# Patient Record
Sex: Female | Born: 1955 | Race: Black or African American | Hispanic: No | Marital: Married
Health system: Southern US, Community
[De-identification: ages and names within clinical notes are randomized; demographics above are authoritative.]

## PROBLEM LIST (undated history)

## (undated) DIAGNOSIS — E785 Hyperlipidemia, unspecified: Secondary | ICD-10-CM

## (undated) HISTORY — DX: Hyperlipidemia, unspecified: E78.5

---

## 2005-07-29 ENCOUNTER — Ambulatory Visit (HOSPITAL_COMMUNITY): Admission: RE | Admit: 2005-07-29 | Discharge: 2005-07-29 | Payer: Self-pay | Admitting: Internal Medicine

## 2005-11-13 ENCOUNTER — Other Ambulatory Visit: Admission: RE | Admit: 2005-11-13 | Discharge: 2005-11-13 | Payer: Self-pay | Admitting: Internal Medicine

## 2006-08-02 ENCOUNTER — Ambulatory Visit (HOSPITAL_COMMUNITY): Admission: RE | Admit: 2006-08-02 | Discharge: 2006-08-02 | Payer: Self-pay | Admitting: Internal Medicine

## 2006-08-17 ENCOUNTER — Encounter: Admission: RE | Admit: 2006-08-17 | Discharge: 2006-08-17 | Payer: Self-pay | Admitting: Internal Medicine

## 2007-02-04 ENCOUNTER — Encounter: Admission: RE | Admit: 2007-02-04 | Discharge: 2007-02-04 | Payer: Self-pay | Admitting: Internal Medicine

## 2007-03-29 ENCOUNTER — Other Ambulatory Visit: Admission: RE | Admit: 2007-03-29 | Discharge: 2007-03-29 | Payer: Self-pay | Admitting: Internal Medicine

## 2007-08-16 ENCOUNTER — Encounter: Admission: RE | Admit: 2007-08-16 | Discharge: 2007-08-16 | Payer: Self-pay | Admitting: Internal Medicine

## 2008-03-27 ENCOUNTER — Other Ambulatory Visit: Admission: RE | Admit: 2008-03-27 | Discharge: 2008-03-27 | Payer: Self-pay | Admitting: Internal Medicine

## 2008-05-05 IMAGING — MG MM DIGITAL SCREENING BILAT
5 series · 5 of 5 positions shown · non-contrast
Comparison: none

DG SCREEN MAMMOGRAM BILATERAL
Bilateral CC and MLO view(s) were taken.

DIGITAL SCREENING MAMMOGRAM WITH CAD:
There are scattered fibroglandular densities.  A possible mass is noted in the left breast.  Spot 
compression views and possibly sonography are recommended for further evaluation.  In the right 
breast, no masses or malignant type calcifications are identified.  Compared with prior studies.

[R CC]
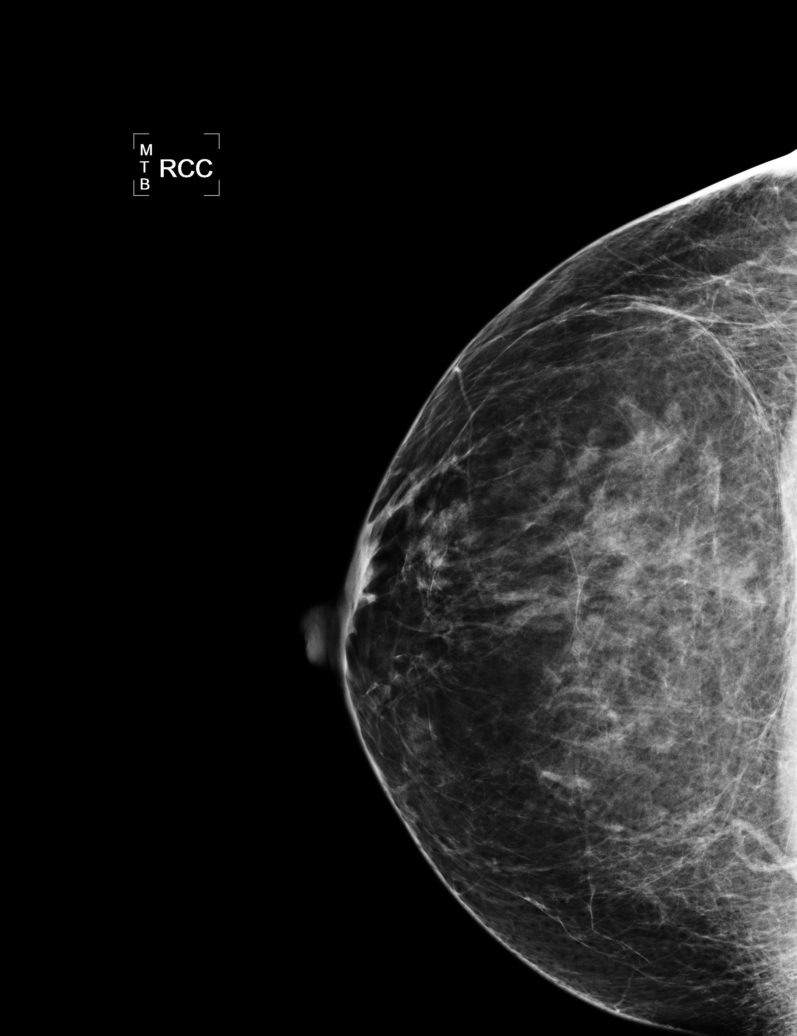

[R MLO]
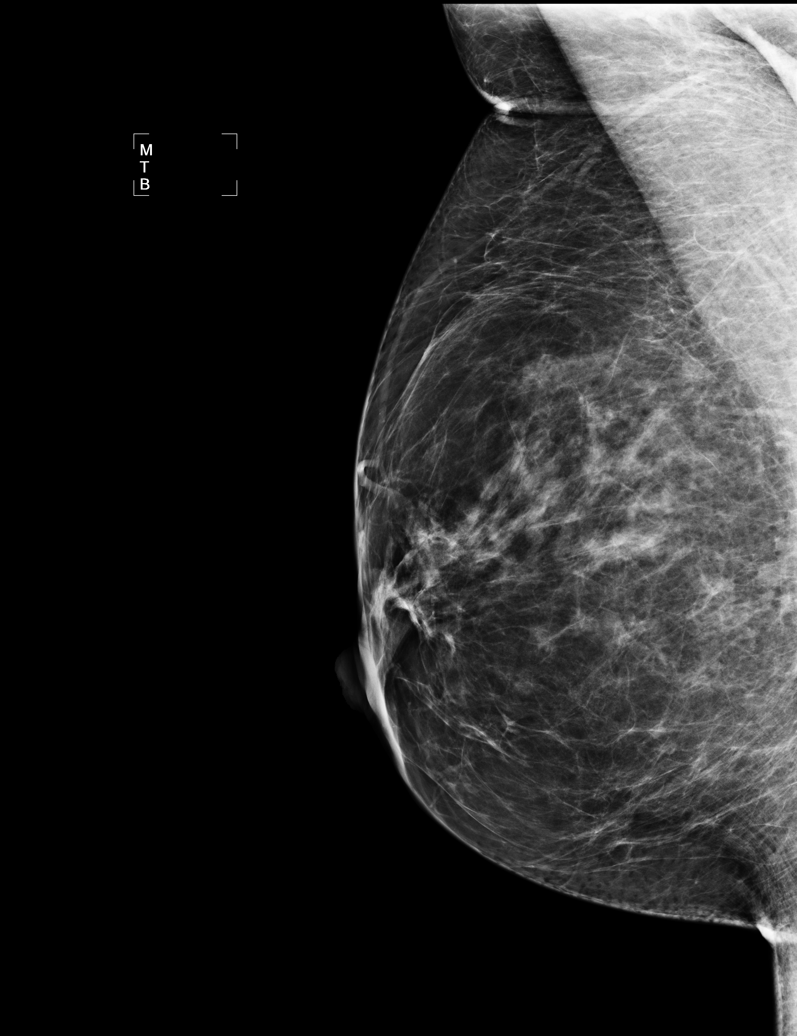

[L CC]
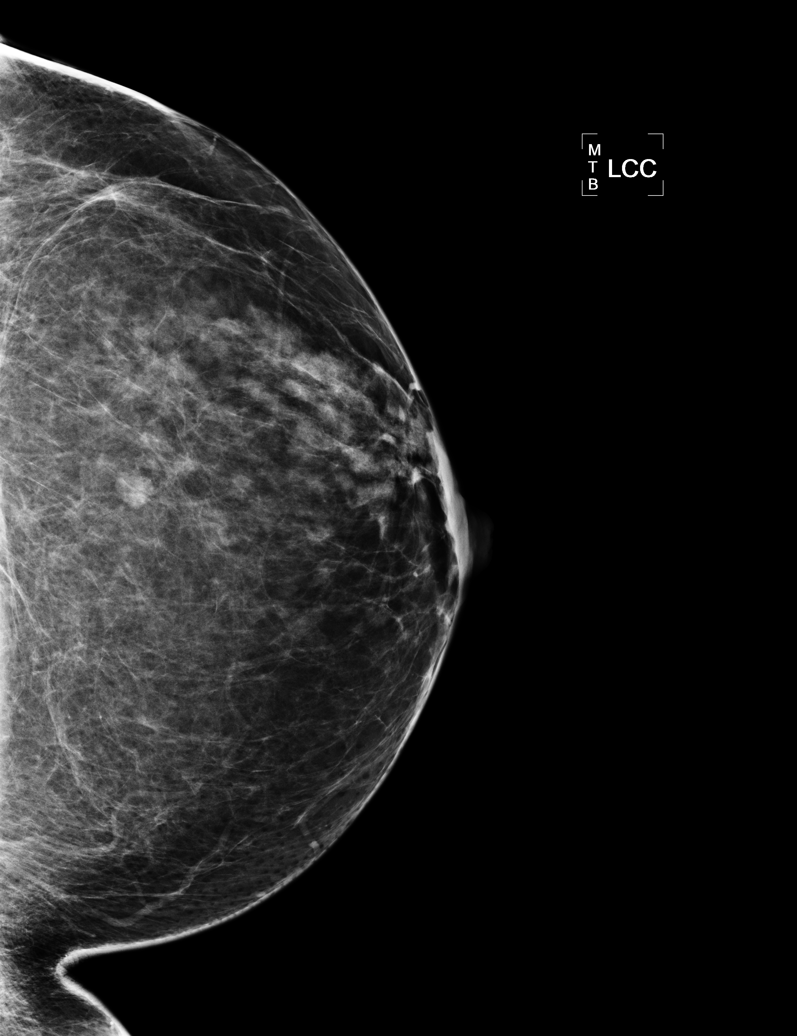

[L MLO]
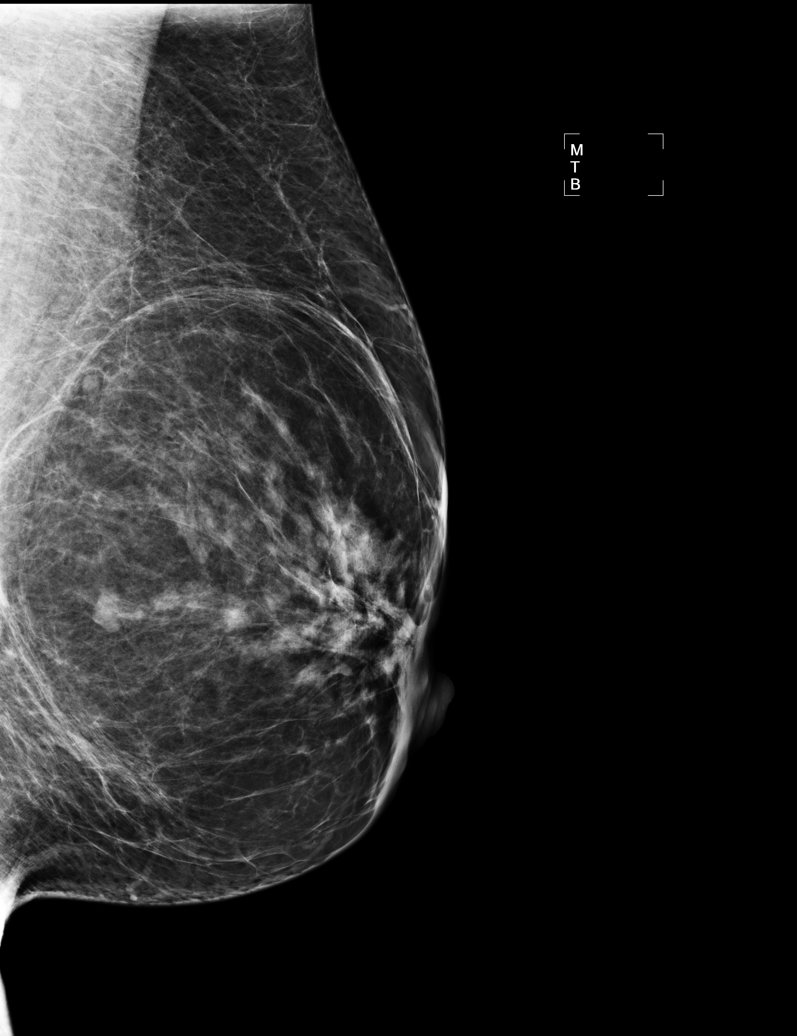

[R CV]
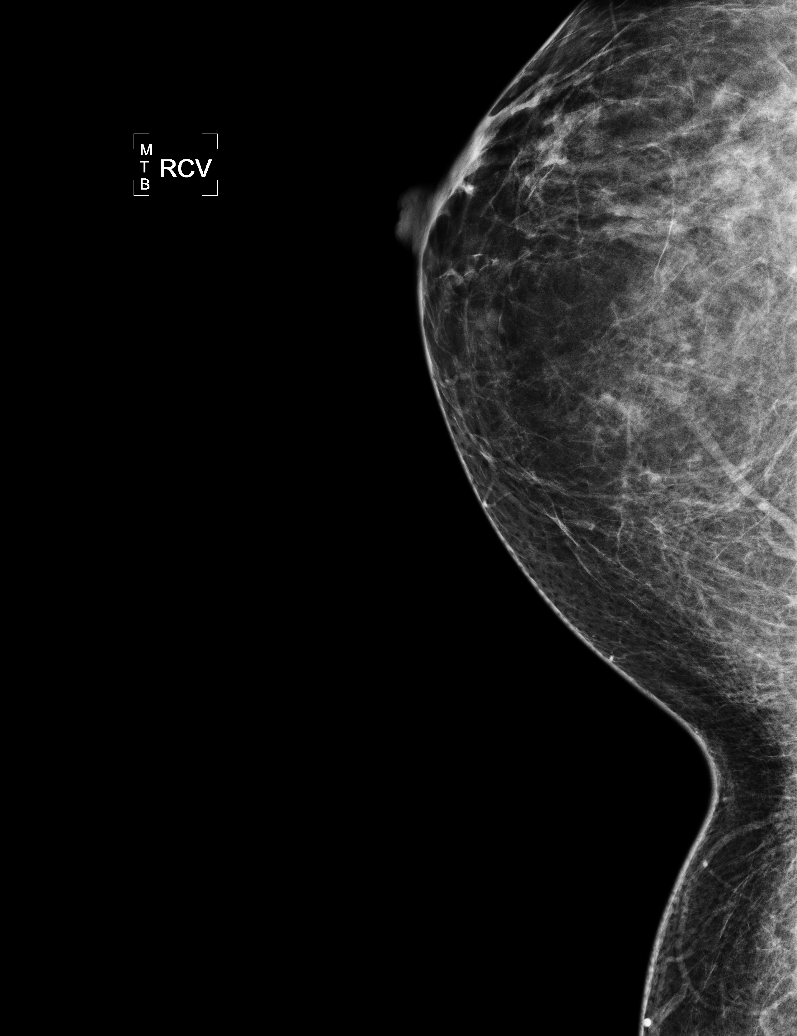

[5 of 5 positions shown; findings below may reference images not displayed]

IMPRESSION: Possible mass, left breast.  Additional evaluation is indicated.  The patient will be contacted for
additional studies and a supplementary report will follow.  No specific mammographic evidence of 
malignancy, right breast.

ASSESSMENT: Need additional imaging evaluation and/or prior mammograms for comparison - BI-RADS 0

Further imaging of the left breast.
ANALYZED BY COMPUTER AIDED DETECTION. , THIS PROCEDURE WAS A DIGITAL MAMMOGRAM.

## 2008-08-20 ENCOUNTER — Ambulatory Visit (HOSPITAL_COMMUNITY): Admission: RE | Admit: 2008-08-20 | Discharge: 2008-08-20 | Payer: Self-pay | Admitting: Internal Medicine

## 2009-09-17 ENCOUNTER — Ambulatory Visit (HOSPITAL_COMMUNITY): Admission: RE | Admit: 2009-09-17 | Discharge: 2009-09-17 | Payer: Self-pay | Admitting: Internal Medicine

## 2010-09-01 ENCOUNTER — Other Ambulatory Visit: Payer: Self-pay | Admitting: Internal Medicine

## 2010-09-01 DIAGNOSIS — Z1231 Encounter for screening mammogram for malignant neoplasm of breast: Secondary | ICD-10-CM

## 2010-09-25 ENCOUNTER — Ambulatory Visit (HOSPITAL_COMMUNITY)
Admission: RE | Admit: 2010-09-25 | Discharge: 2010-09-25 | Disposition: A | Discharge status: Home / Self Care | Payer: BC Managed Care – HMO | Source: Ambulatory Visit | Attending: Internal Medicine | Admitting: Internal Medicine

## 2010-09-25 DIAGNOSIS — Z1231 Encounter for screening mammogram for malignant neoplasm of breast: Secondary | ICD-10-CM

## 2011-04-19 ENCOUNTER — Emergency Department (HOSPITAL_COMMUNITY): Payer: BC Managed Care – PPO

## 2011-04-19 ENCOUNTER — Observation Stay (HOSPITAL_COMMUNITY)
Admission: EM | Admit: 2011-04-19 | Discharge: 2011-04-20 | Disposition: A | Discharge status: Home / Self Care | Payer: BC Managed Care – PPO | Attending: Internal Medicine | Admitting: Internal Medicine

## 2011-04-19 DIAGNOSIS — R55 Syncope and collapse: Principal | ICD-10-CM | POA: Insufficient documentation

## 2011-04-19 LAB — CBC
HCT: 40.2 % (ref 36.0–46.0)
Hemoglobin: 13.4 g/dL (ref 12.0–15.0)
MCV: 89.3 fL (ref 78.0–100.0)
Platelets: 196 10*3/uL (ref 150–400)
WBC: 6.3 10*3/uL (ref 4.0–10.5)

## 2011-04-19 LAB — COMPREHENSIVE METABOLIC PANEL
ALT: 10 U/L (ref 0–35)
AST: 17 U/L (ref 0–37)
Albumin: 3.7 g/dL (ref 3.5–5.2)
Alkaline Phosphatase: 65 U/L (ref 39–117)
BUN: 11 mg/dL (ref 6–23)
Chloride: 104 mEq/L (ref 96–112)
GFR calc Af Amer: >90 mL/min (ref >90)
GFR calc non Af Amer: 83 mL/min — ABNORMAL LOW (ref >90)
Sodium: 140 mEq/L (ref 135–145)

## 2011-04-19 LAB — URINALYSIS, ROUTINE W REFLEX MICROSCOPIC
Hgb urine dipstick: NEGATIVE
Leukocytes, UA: NEGATIVE
Nitrite: NEGATIVE

## 2011-04-19 LAB — DIFFERENTIAL
Basophils Absolute: 0 10*3/uL (ref 0.0–0.1)
Basophils Relative: 0 % (ref 0–1)
Eosinophils Absolute: 0 10*3/uL (ref 0.0–0.7)
Monocytes Absolute: 0.6 10*3/uL (ref 0.1–1.0)
Neutro Abs: 4.6 10*3/uL (ref 1.7–7.7)

## 2011-04-19 LAB — PROTIME-INR: INR: 1 (ref 0.00–1.49)

## 2011-04-19 LAB — APTT: aPTT: 31 seconds (ref 24–37)

## 2011-04-19 LAB — CARDIAC PANEL(CRET KIN+CKTOT+MB+TROPI)
CK, MB: 3.3 ng/mL (ref 0.3–4.0)
Total CK: 131 U/L (ref 7–177)

## 2011-04-19 LAB — POCT I-STAT TROPONIN I: Troponin i, poc: 0.01 ng/mL (ref 0.00–0.08)

## 2011-04-20 LAB — CARDIAC PANEL(CRET KIN+CKTOT+MB+TROPI)
CK, MB: 2.4 ng/mL (ref 0.3–4.0)
Relative Index: 2.3 (ref 0.0–2.5)

## 2011-04-20 LAB — CBC
HCT: 35.7 % — ABNORMAL LOW (ref 36.0–46.0)
Hemoglobin: 11.6 g/dL — ABNORMAL LOW (ref 12.0–15.0)
MCH: 29.2 pg (ref 26.0–34.0)
Platelets: 162 10*3/uL (ref 150–400)

## 2011-04-20 LAB — BASIC METABOLIC PANEL
BUN: 9 mg/dL (ref 6–23)
Calcium: 9.1 mg/dL (ref 8.4–10.5)
Chloride: 108 mEq/L (ref 96–112)
Creatinine, Ser: 0.69 mg/dL (ref 0.50–1.10)
GFR calc non Af Amer: >90 mL/min (ref >90)
Glucose, Bld: 86 mg/dL (ref 70–99)
Potassium: 4.1 mEq/L (ref 3.5–5.1)
Sodium: 140 mEq/L (ref 135–145)

## 2011-04-29 NOTE — Discharge Summary (Addendum)
[name], [name] NO.:  [id number]  MEDICAL RECORD NO.:  [id number]  LOCATION:  1418                         FACILITY:  [facility]  PHYSICIAN:  Rosanna Randy, MDDATE OF BIRTH:  1955-07-07  DATE OF ADMISSION:  04/19/2011 DATE OF DISCHARGE:  04/20/2011                              DISCHARGE SUMMARY   PRIMARY CARE PROVIDER:  Minerva Areola L. August Saucer, M.D.  DISCHARGE DIAGNOSIS:  Syncope.  DISCHARGE MEDICATIONS:  Unisom over-the-counter 1 tablet p.o. daily at bedtime.  DIAGNOSTIC LABS:  WBC 6.3, hemoglobin 13.4, hematocrit 40.2, platelets 196.  Sodium 140, potassium 4.3, chloride 104, CO2 of 25, BUN 11, creatinine 0.80.  PTT 31, PT 13.4, INR 1.0.  Urinalysis was negative. First set of cardiac enzymes yielded total CK 131, CK-MB 3.3, troponin I less than 0.30.  Second set of cardiac enzymes yield a total CK of 133, CK-MB 3.0, troponin I less than 0.30.  Third set of cardiac enzymes yielded total CK 118, CK-MB 2.4, troponin I less than 0.30.  DIAGNOSTIC IMAGING: 1. Chest x-ray on April 19, 2011, shows no acute cardiopulmonary     disease. 2. 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     Otherwise, unremarkable. 3. 2D echo done on April 20, 2011, results are pending at the time     of this dictation.  CONSULTATIONS:  None.  PROCEDURES:  None.  BRIEF HISTORY:  Ms. Laura Evans is a very pleasant 55 year old female with virtually no medical history, who presented to the Emergency Department at [facility] on April 19, 2011, after a syncopal event at church. The patient provided information in the ED.  She reported that for the last 3 or 4 days, she has had a headache, but the headache came and went, after taking Tylenol.  The patient indicates that she did not have a headache just prior to the episode, but that she had fasted since 6 p.m. the evening before.  She also reports that  she is part of the choir at her church, and she was singing very enthusiastically when she began to feel her heart racing and somewhat short of breath.  She indicated that there was no real dizziness, pain, and that her next memory is being on the ground and people asking her to wake up.  She does remember trying to reach for the microphone stand as she went down.  Family reports that the patient was down for less than 2 minutes.  There were no recent fever, chills, nausea, vomiting, diarrhea.  She denies any history of chest pain or palpitations prior to this episode.  The patient was admitted to Telemetry by the hospitalist service for further evaluation and treatment.  HOSPITAL COURSE BY PROBLEM: 1. Syncope, most likely secondary to a vasovagal cause, and probably     some volume depletion and questionable dehydration.  The patient     did report having fasted since 6 p.m., the evening before.  She     also reported singing in the choir, just prior to this episode,     very enthusiastically.  The patient was  admitted to Telemetry.  EKG     was done, yielded normal sinus rhythm.  Cardiac enzymes were     cycled, as indicated above were negative.  2D echo was obtained     this morning and results are pending at the time of this dictation.     Anticipate discharging patient to home and we will call her with     the results of the 2D echo.  The patient has ambulated with     assistance and independently has experienced no further episodes.     I suspect the combination of fasting and singing in the choir led     to vasovagal affect.  I did speak at length with the patient about     this process and encouraged her to sing with a little less     enthusiasm and perhaps have a limited fast prior to services.  PHYSICAL EXAMINATION:  VITAL SIGNS:  Temperature 98.4, blood pressure 106/68, heart rate 50, respirations 18, saturations 99% on room air. GENERAL:  Awake, alert, well-nourished,  well-hydrated.  No acute distress. CV:  Regular rate and rhythm.  No murmur, gallop, or rub.  No lower extremity edema. RESPIRATORY:  No increased work of breathing.  Breath sounds clear to auscultation bilaterally.  No rhonchi, wheezes, or rales. ABDOMEN:  Flat, soft, positive bowel sounds throughout.  Nontender to palpation.  No mass or organomegaly noted. NEUROLOGIC:  Alert and oriented x3.  Speech clear.  Facial symmetry. Cranial nerves II-XII grossly intact.  DIET:  Regular.  ACTIVITY:  As tolerated.  FOLLOWUP:  I would recommend the patient see her primary care provider in the next 10 to 14 days as followup to hospitalization.  Also recommended that the patient limit her fasting and/or minimize the enthusiasm with which she sings in the choir on Sunday mornings.  DISPOSITION:  The patient is medically stable and ready for discharge.  Time spent on this discharge, 30 minutes.     Gwenyth Bender, NP   ______________________________ Rosanna Randy, MD    KMB/MEDQ  D:  04/20/2011  T:  04/20/2011  Job:  [id number]  cc:   Minerva Areola L. August Saucer, M.D. [address] [place], [place] [address] [phone]  Electronically Signed by Toya Smothers  on 04/29/2011 03:50:04 PM Electronically Signed by Vassie Loll MD on 05/08/2011 10:30:45 PM

## 2011-05-08 NOTE — H&P (Signed)
Laura Evans, Laura Evans NO.:  [id number]  MEDICAL RECORD NO.:  [id number]  LOCATION:  WLED                         FACILITY:  [facility]  PHYSICIAN:  Lionel December, MD  DATE OF BIRTH:  16-Oct-1955  DATE OF ADMISSION:  04/19/2011 DATE OF DISCHARGE:                             HISTORY & PHYSICAL   REQUESTING PHYSICIAN:  Dr. Brooke Dare in the [facility] Emergency Department.  PRIMARY CARE PHYSICIAN:  Eric L. August Saucer, M.D.  CHIEF COMPLAINT:  Syncope.  HISTORY OF PRESENT ILLNESS:  This is a 55 year old woman with no past medical history, who presents to the emergency department after passing out in church.  The patient states for the last 3-4 days she has had headaches, but the ache came and went.  The patient states by the time she got to church this morning, she was not having headache.  She was a part of the choir and was singing and when she was singing, she felt her heart racing, then felt short of breath; so she backed down on the force that she was singing with and then she started to feeling heaviness in her chest, reached for the microphone stand and the next thing she remembers someone was asking her to wake up.  Per her family members in the room, husband and uncle, the patient was down for less than 2 minutes.  The patient denies any symptoms of fevers, chills, sweats, or dizziness.  Other than the chest pressure noted above, denies any chest pain or palpitations other than this event.  The patient denies any nausea, vomiting, diarrhea, or constipation.  Denies any lower extremity edema or orthopnea.  PAST MEDICAL HISTORY:  The patient states she had a heart murmur as a child, but has not been told us recently.  MEDICATIONS:  None.  ALLERGIES: 1. PENICILLIN. 2. SULFA. 3. CEPHALOSPORINS.  SOCIAL HISTORY:  The patient lives in [place] with her husband. Denies any tobacco, alcohol, or illicit drug use.  The patient runs 3 days per week.  The last time  she ran was Friday afternoon, ran 2-3 miles that day without any symptoms at all.  FAMILY HISTORY:  Significant for hypertension in both mother and father, but both parents are otherwise healthy.  Her uncle has diabetes.  REVIEW OF SYSTEMS:  Twelve-point review of systems was reviewed and negative with the exception as in the HPI.  PHYSICAL EXAMINATION:  VITAL SIGNS:  Temperature 98.2, pulse 67, respiratory rate 14, and blood pressure 133/66. GENERAL:  Awake, alert, and oriented x3.  No acute distress. HEENT:  Pupils equal, round, and reactive with light and accommodation. Extraocular movements are intact.  Mucous membranes are moist. Oropharynx are clear without erythema or exudate. NECK:  Supple.  No JVD.  No lymphadenopathy.  No carotid bruits. CARDIOVASCULAR:  Regular rate and rhythm.  No murmurs, rubs, or gallops. CHEST:  Clear to auscultation bilaterally.  No wheezes, rhonchi, or rales. ABDOMEN:  Soft, nontender, and nondistended.  No active bowel sounds. EXTREMITIES:  No clubbing, cyanosis, or edema. SKIN:  No rashes or ulcerations. NEUROLOGIC:  Cranial nerves II through XII are intact.  No focal, sensory, or motor deficits are noted and the patient's  mental status is normal.  LABORATORY DATA:  Urinalysis was within normal limits.  White blood cells 6.3, hemoglobin 13.4, hematocrit 40.2, and platelets 196.  Sodium 140, potassium 4.3, chloride 104, bicarbonate 25, BUN 11, creatinine 0.80, glucose 90, calcium 10.1, total protein 7.4, and albumin 3.7. LFTs within normal limits.  RADIOLOGY: 1. CT of head without contrast shows mild chronic small vessel white     matte ischemic demyelination, otherwise unremarkable. 2. Two-view chest x-ray shows no acute cardiopulmonary disease.  ASSESSMENT AND PLAN: 1. Syncope.  I suspect that this patient has syncope likely secondary     to vasovagal causes and possible volume depletion.  The patient is     an athlete, however, cardiac  disease is less likely.  I do not hear     any carotid bruit; however, given the patient's history of having a     murmur as a child, we would like to get a cardiac echocardiogram to     rule out any significant valvular disorder or hypertrophic     obstructive cardiomyopathy that may have contributed to the     patient's syncopal episode.  Otherwise, the patient will likely be     able to be discharged tomorrow.  We will admit the patient to     observation status at this point in time and continue to monitor on     telemetry to rule out any recurrent arrhythmias at this point in     time.  We will cycle the patient's cardiac enzymes and re-check an     electrocardiogram. 2. FEN/prophylaxis.  The patient will have sequential compression     devices for deep venous thrombosis prophylaxis.  We will have     general nutrition diet and we will replete the electrolytes if     needed. 3. Code status:  The patient is presumed for code, this was not     discussed.          ______________________________ Lionel December, MD     MC/MEDQ  D:  04/19/2011  T:  04/19/2011  Job:  [id number]  Electronically Signed by Lionel December MD on 05/08/2011 69:62:95 PM

## 2011-10-06 ENCOUNTER — Other Ambulatory Visit: Payer: Self-pay | Admitting: Internal Medicine

## 2011-10-06 DIAGNOSIS — Z1231 Encounter for screening mammogram for malignant neoplasm of breast: Secondary | ICD-10-CM

## 2011-10-30 ENCOUNTER — Ambulatory Visit (HOSPITAL_COMMUNITY)
Admission: RE | Admit: 2011-10-30 | Discharge: 2011-10-30 | Disposition: A | Discharge status: Home / Self Care | Payer: BC Managed Care – HMO | Source: Ambulatory Visit | Attending: Internal Medicine | Admitting: Internal Medicine

## 2011-10-30 DIAGNOSIS — Z1231 Encounter for screening mammogram for malignant neoplasm of breast: Secondary | ICD-10-CM | POA: Insufficient documentation

## 2011-10-30 LAB — HM MAMMOGRAPHY

## 2012-07-29 ENCOUNTER — Encounter (HOSPITAL_COMMUNITY): Payer: Self-pay | Admitting: General Practice

## 2012-12-20 ENCOUNTER — Other Ambulatory Visit (HOSPITAL_COMMUNITY): Payer: Self-pay | Admitting: Internal Medicine

## 2012-12-20 DIAGNOSIS — Z1231 Encounter for screening mammogram for malignant neoplasm of breast: Secondary | ICD-10-CM

## 2013-01-03 ENCOUNTER — Ambulatory Visit (HOSPITAL_COMMUNITY)
Admission: RE | Admit: 2013-01-03 | Discharge: 2013-01-03 | Disposition: A | Discharge status: Home / Self Care | Payer: BC Managed Care – PPO | Source: Ambulatory Visit | Attending: Internal Medicine | Admitting: Internal Medicine

## 2013-01-03 DIAGNOSIS — Z1231 Encounter for screening mammogram for malignant neoplasm of breast: Secondary | ICD-10-CM | POA: Insufficient documentation

## 2014-01-11 ENCOUNTER — Other Ambulatory Visit (HOSPITAL_COMMUNITY): Payer: Self-pay | Admitting: Internal Medicine

## 2014-01-11 DIAGNOSIS — Z1231 Encounter for screening mammogram for malignant neoplasm of breast: Secondary | ICD-10-CM

## 2014-01-12 ENCOUNTER — Ambulatory Visit (HOSPITAL_COMMUNITY)
Admission: RE | Admit: 2014-01-12 | Discharge: 2014-01-12 | Disposition: A | Discharge status: Home / Self Care | Payer: BC Managed Care – PPO | Source: Ambulatory Visit | Attending: Internal Medicine | Admitting: Internal Medicine

## 2014-01-12 DIAGNOSIS — Z1231 Encounter for screening mammogram for malignant neoplasm of breast: Secondary | ICD-10-CM | POA: Insufficient documentation

## 2014-12-31 ENCOUNTER — Other Ambulatory Visit (HOSPITAL_COMMUNITY): Payer: Self-pay | Admitting: Internal Medicine

## 2014-12-31 DIAGNOSIS — Z1231 Encounter for screening mammogram for malignant neoplasm of breast: Secondary | ICD-10-CM

## 2015-01-23 ENCOUNTER — Ambulatory Visit (HOSPITAL_COMMUNITY)
Admission: RE | Admit: 2015-01-23 | Discharge: 2015-01-23 | Disposition: A | Discharge status: Home / Self Care | Payer: No Typology Code available for payment source | Source: Ambulatory Visit | Attending: Internal Medicine | Admitting: Internal Medicine

## 2015-01-23 DIAGNOSIS — Z1231 Encounter for screening mammogram for malignant neoplasm of breast: Secondary | ICD-10-CM | POA: Diagnosis not present

## 2016-01-10 ENCOUNTER — Other Ambulatory Visit: Payer: Self-pay | Admitting: Internal Medicine

## 2016-01-10 DIAGNOSIS — Z1231 Encounter for screening mammogram for malignant neoplasm of breast: Secondary | ICD-10-CM

## 2016-01-28 ENCOUNTER — Ambulatory Visit: Payer: No Typology Code available for payment source

## 2016-02-12 ENCOUNTER — Ambulatory Visit
Admission: RE | Admit: 2016-02-12 | Discharge: 2016-02-12 | Disposition: A | Discharge status: Home / Self Care | Payer: No Typology Code available for payment source | Source: Ambulatory Visit | Attending: Internal Medicine | Admitting: Internal Medicine

## 2016-02-12 DIAGNOSIS — Z1231 Encounter for screening mammogram for malignant neoplasm of breast: Secondary | ICD-10-CM

## 2017-02-16 ENCOUNTER — Other Ambulatory Visit: Payer: Self-pay | Admitting: Internal Medicine

## 2017-02-16 DIAGNOSIS — Z1231 Encounter for screening mammogram for malignant neoplasm of breast: Secondary | ICD-10-CM

## 2017-03-03 ENCOUNTER — Ambulatory Visit
Admission: RE | Admit: 2017-03-03 | Discharge: 2017-03-03 | Disposition: A | Discharge status: Home / Self Care | Payer: BC Managed Care – PPO | Source: Ambulatory Visit | Attending: Internal Medicine | Admitting: Internal Medicine

## 2017-03-03 DIAGNOSIS — Z1231 Encounter for screening mammogram for malignant neoplasm of breast: Secondary | ICD-10-CM

## 2018-02-02 ENCOUNTER — Other Ambulatory Visit: Payer: Self-pay | Admitting: Internal Medicine

## 2018-02-02 DIAGNOSIS — Z1231 Encounter for screening mammogram for malignant neoplasm of breast: Secondary | ICD-10-CM

## 2018-03-10 ENCOUNTER — Ambulatory Visit
Admission: RE | Admit: 2018-03-10 | Discharge: 2018-03-10 | Disposition: A | Discharge status: Home / Self Care | Payer: BC Managed Care – PPO | Source: Ambulatory Visit | Attending: Internal Medicine | Admitting: Internal Medicine

## 2018-03-10 DIAGNOSIS — Z1231 Encounter for screening mammogram for malignant neoplasm of breast: Secondary | ICD-10-CM

## 2019-03-01 ENCOUNTER — Other Ambulatory Visit: Payer: Self-pay | Admitting: Internal Medicine

## 2019-03-01 DIAGNOSIS — Z1231 Encounter for screening mammogram for malignant neoplasm of breast: Secondary | ICD-10-CM

## 2019-04-13 ENCOUNTER — Ambulatory Visit
Admission: RE | Admit: 2019-04-13 | Discharge: 2019-04-13 | Disposition: A | Discharge status: Home / Self Care | Payer: BC Managed Care – PPO | Source: Ambulatory Visit | Attending: Internal Medicine | Admitting: Internal Medicine

## 2019-04-13 ENCOUNTER — Other Ambulatory Visit: Payer: Self-pay

## 2019-04-13 DIAGNOSIS — Z1231 Encounter for screening mammogram for malignant neoplasm of breast: Secondary | ICD-10-CM

## 2019-05-17 ENCOUNTER — Ambulatory Visit
Admission: RE | Admit: 2019-05-17 | Discharge: 2019-05-17 | Disposition: A | Discharge status: Home / Self Care | Payer: BC Managed Care – PPO | Source: Ambulatory Visit | Attending: Internal Medicine | Admitting: Internal Medicine

## 2019-05-17 ENCOUNTER — Other Ambulatory Visit: Payer: Self-pay | Admitting: Internal Medicine

## 2019-05-17 DIAGNOSIS — R609 Edema, unspecified: Secondary | ICD-10-CM

## 2019-05-17 DIAGNOSIS — M25561 Pain in right knee: Secondary | ICD-10-CM

## 2019-09-09 ENCOUNTER — Ambulatory Visit: Payer: Self-pay | Attending: Internal Medicine

## 2019-09-09 DIAGNOSIS — Z23 Encounter for immunization: Secondary | ICD-10-CM | POA: Insufficient documentation

## 2019-09-09 NOTE — Progress Notes (Signed)
   Covid-19 Vaccination Clinic  Name:  Laura Evans    MRN: [id number] DOB: 1956/05/12  09/09/2019  [name] was observed post Covid-19 immunization for 15 minutes without incident. She was provided with Vaccine Information Sheet and instruction to access the V-Safe system.   [name] was instructed to call 911 with any severe reactions post vaccine: Marland Kitchen Difficulty breathing  . Swelling of face and throat  . A fast heartbeat  . A bad rash all over body  . Dizziness and weakness   Immunizations Administered    Name Date Dose VIS Date Route   Pfizer COVID-19 Vaccine 09/09/2019 12:14 PM 0.3 mL 06/16/2019 Intramuscular   Manufacturer: [employer], [employer]   Lot: [id number]   NDC: [address]-3

## 2019-09-14 ENCOUNTER — Ambulatory Visit: Payer: Self-pay

## 2019-09-30 ENCOUNTER — Ambulatory Visit: Payer: Self-pay | Attending: Internal Medicine

## 2019-09-30 DIAGNOSIS — Z23 Encounter for immunization: Secondary | ICD-10-CM

## 2019-09-30 NOTE — Progress Notes (Signed)
   Covid-19 Vaccination Clinic  Name:  [name]    MRN: [id number] DOB: 1956/01/27  09/30/2019  Ms. Tejera was observed post Covid-19 immunization for 15 minutes without incident. She was provided with Vaccine Information Sheet and instruction to access the V-Safe system.   Ms. Fiorenza was instructed to call 911 with any severe reactions post vaccine: Marland Kitchen Difficulty breathing  . Swelling of face and throat  . A fast heartbeat  . A bad rash all over body  . Dizziness and weakness   Immunizations Administered    Name Date Dose VIS Date Route   Pfizer COVID-19 Vaccine 09/30/2019 12:39 PM 0.3 mL 06/16/2019 Intramuscular   Manufacturer: [employer], [employer]   Lot: [id number]   NDC: [address]-7

## 2020-03-26 ENCOUNTER — Other Ambulatory Visit: Payer: Self-pay | Admitting: Internal Medicine

## 2020-03-26 DIAGNOSIS — Z1231 Encounter for screening mammogram for malignant neoplasm of breast: Secondary | ICD-10-CM

## 2020-04-17 ENCOUNTER — Other Ambulatory Visit: Payer: Self-pay

## 2020-04-17 ENCOUNTER — Ambulatory Visit
Admission: RE | Admit: 2020-04-17 | Discharge: 2020-04-17 | Disposition: A | Discharge status: Home / Self Care | Payer: BC Managed Care – PPO | Source: Ambulatory Visit | Attending: Internal Medicine | Admitting: Internal Medicine

## 2020-04-17 DIAGNOSIS — Z1231 Encounter for screening mammogram for malignant neoplasm of breast: Secondary | ICD-10-CM

## 2021-03-24 ENCOUNTER — Other Ambulatory Visit: Payer: Self-pay | Admitting: Internal Medicine

## 2021-03-24 DIAGNOSIS — Z1231 Encounter for screening mammogram for malignant neoplasm of breast: Secondary | ICD-10-CM

## 2021-04-24 ENCOUNTER — Other Ambulatory Visit: Payer: Self-pay

## 2021-04-24 ENCOUNTER — Ambulatory Visit
Admission: RE | Admit: 2021-04-24 | Discharge: 2021-04-24 | Disposition: A | Discharge status: Home / Self Care | Payer: BC Managed Care – PPO | Source: Ambulatory Visit | Attending: Internal Medicine | Admitting: Internal Medicine

## 2021-04-24 DIAGNOSIS — Z1231 Encounter for screening mammogram for malignant neoplasm of breast: Secondary | ICD-10-CM

## 2022-01-27 ENCOUNTER — Other Ambulatory Visit: Payer: Self-pay | Admitting: Internal Medicine

## 2022-01-27 ENCOUNTER — Ambulatory Visit
Admission: RE | Admit: 2022-01-27 | Discharge: 2022-01-27 | Disposition: A | Discharge status: Home / Self Care | Payer: BC Managed Care – PPO | Source: Ambulatory Visit | Attending: Internal Medicine | Admitting: Internal Medicine

## 2022-01-27 DIAGNOSIS — R059 Cough, unspecified: Secondary | ICD-10-CM

## 2022-04-24 ENCOUNTER — Other Ambulatory Visit: Payer: Self-pay | Admitting: Internal Medicine

## 2022-04-24 DIAGNOSIS — Z1231 Encounter for screening mammogram for malignant neoplasm of breast: Secondary | ICD-10-CM

## 2022-05-07 DIAGNOSIS — Z23 Encounter for immunization: Secondary | ICD-10-CM | POA: Diagnosis not present

## 2022-06-10 DIAGNOSIS — Z5181 Encounter for therapeutic drug level monitoring: Secondary | ICD-10-CM | POA: Diagnosis not present

## 2022-06-10 DIAGNOSIS — E782 Mixed hyperlipidemia: Secondary | ICD-10-CM | POA: Diagnosis not present

## 2022-06-16 ENCOUNTER — Ambulatory Visit
Admission: RE | Admit: 2022-06-16 | Discharge: 2022-06-16 | Disposition: A | Discharge status: Home / Self Care | Payer: Medicare Other | Source: Ambulatory Visit | Attending: Internal Medicine | Admitting: Internal Medicine

## 2022-06-16 DIAGNOSIS — J301 Allergic rhinitis due to pollen: Secondary | ICD-10-CM | POA: Diagnosis not present

## 2022-06-16 DIAGNOSIS — Z1231 Encounter for screening mammogram for malignant neoplasm of breast: Secondary | ICD-10-CM

## 2022-06-16 DIAGNOSIS — E782 Mixed hyperlipidemia: Secondary | ICD-10-CM | POA: Diagnosis not present

## 2022-06-16 DIAGNOSIS — Z6825 Body mass index (BMI) 25.0-25.9, adult: Secondary | ICD-10-CM | POA: Diagnosis not present

## 2022-06-16 DIAGNOSIS — M170 Bilateral primary osteoarthritis of knee: Secondary | ICD-10-CM | POA: Diagnosis not present

## 2022-07-30 DIAGNOSIS — H524 Presbyopia: Secondary | ICD-10-CM | POA: Diagnosis not present

## 2022-10-01 DIAGNOSIS — K080 Exfoliation of teeth due to systemic causes: Secondary | ICD-10-CM | POA: Diagnosis not present

## 2022-10-22 DIAGNOSIS — E782 Mixed hyperlipidemia: Secondary | ICD-10-CM | POA: Diagnosis not present

## 2022-10-26 DIAGNOSIS — Z712 Person consulting for explanation of examination or test findings: Secondary | ICD-10-CM | POA: Diagnosis not present

## 2022-10-26 DIAGNOSIS — R0683 Snoring: Secondary | ICD-10-CM | POA: Diagnosis not present

## 2022-10-26 DIAGNOSIS — J301 Allergic rhinitis due to pollen: Secondary | ICD-10-CM | POA: Diagnosis not present

## 2022-10-26 DIAGNOSIS — E782 Mixed hyperlipidemia: Secondary | ICD-10-CM | POA: Diagnosis not present

## 2023-01-26 IMAGING — MG MM DIGITAL SCREENING BILAT W/ TOMO AND CAD
6 of 12 series · 6 of 36 positions shown · non-contrast
Comparison: Previous exam(s).

CLINICAL DATA: Screening.

EXAM:
DIGITAL SCREENING BILATERAL MAMMOGRAM WITH TOMOSYNTHESIS AND CAD
TECHNIQUE: Bilateral screening digital craniocaudal and mediolateral oblique
mammograms were obtained. Bilateral screening digital breast
tomosynthesis was performed. The images were evaluated with
computer-aided detection.

[L MLO synth-2D (1 of 2)]
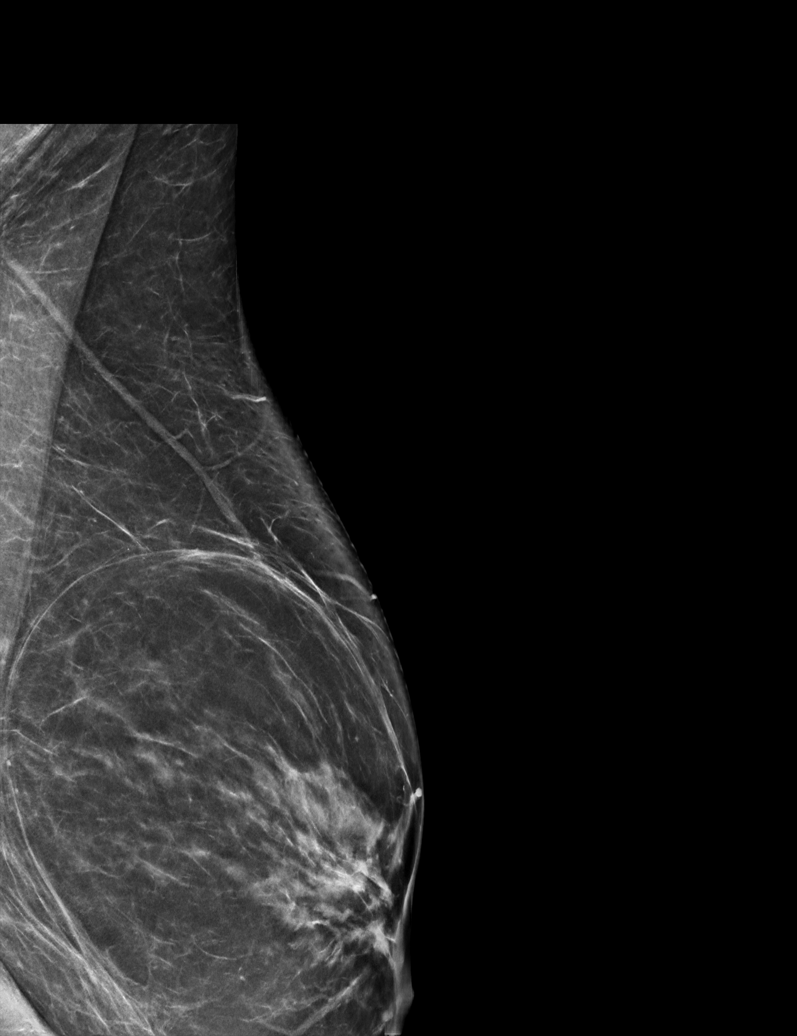

[R CC synth-2D]
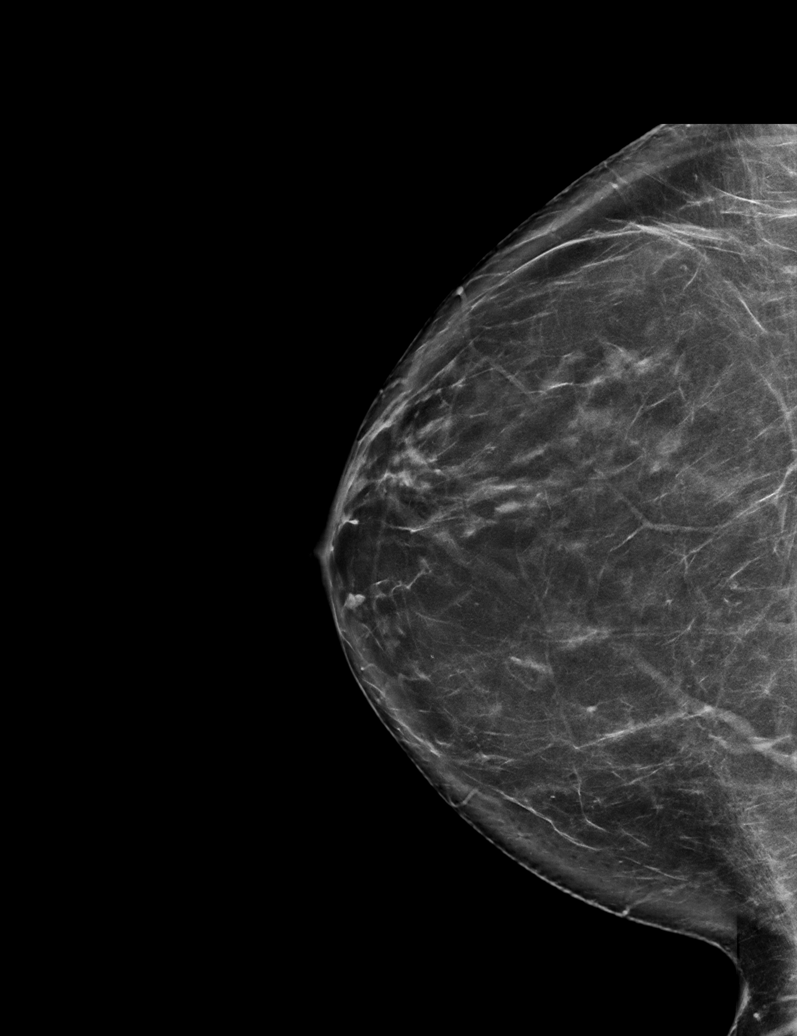

[L CC synth-2D]
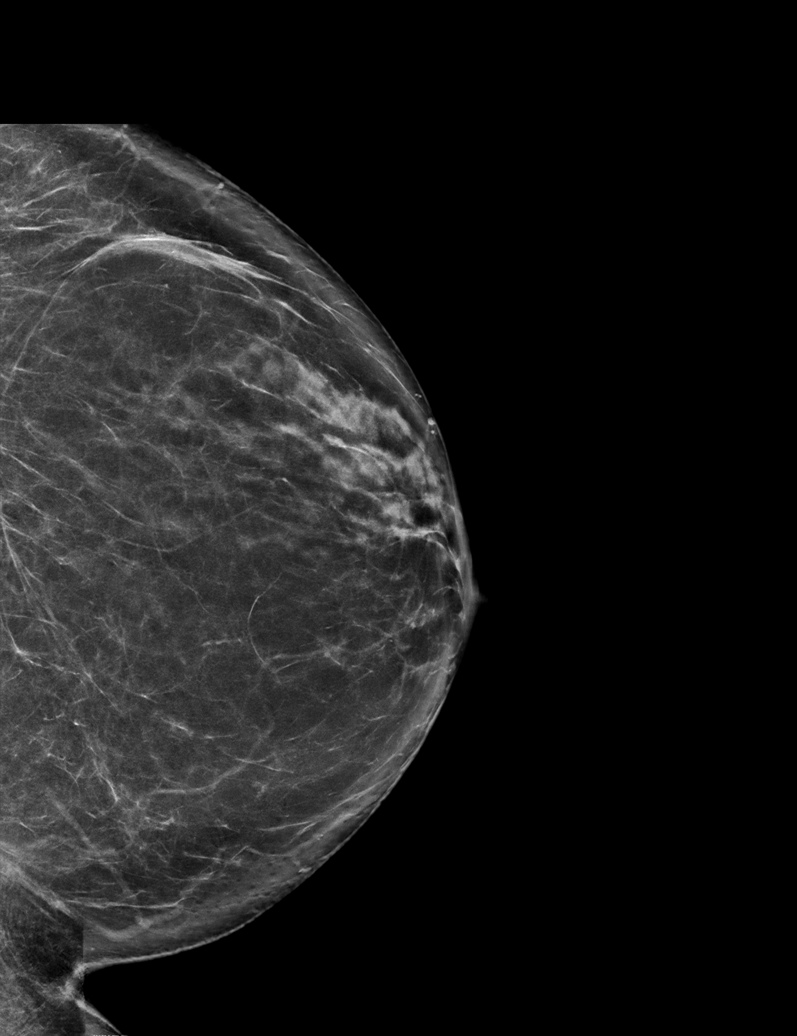

[R MLO synth-2D (1 of 2)]
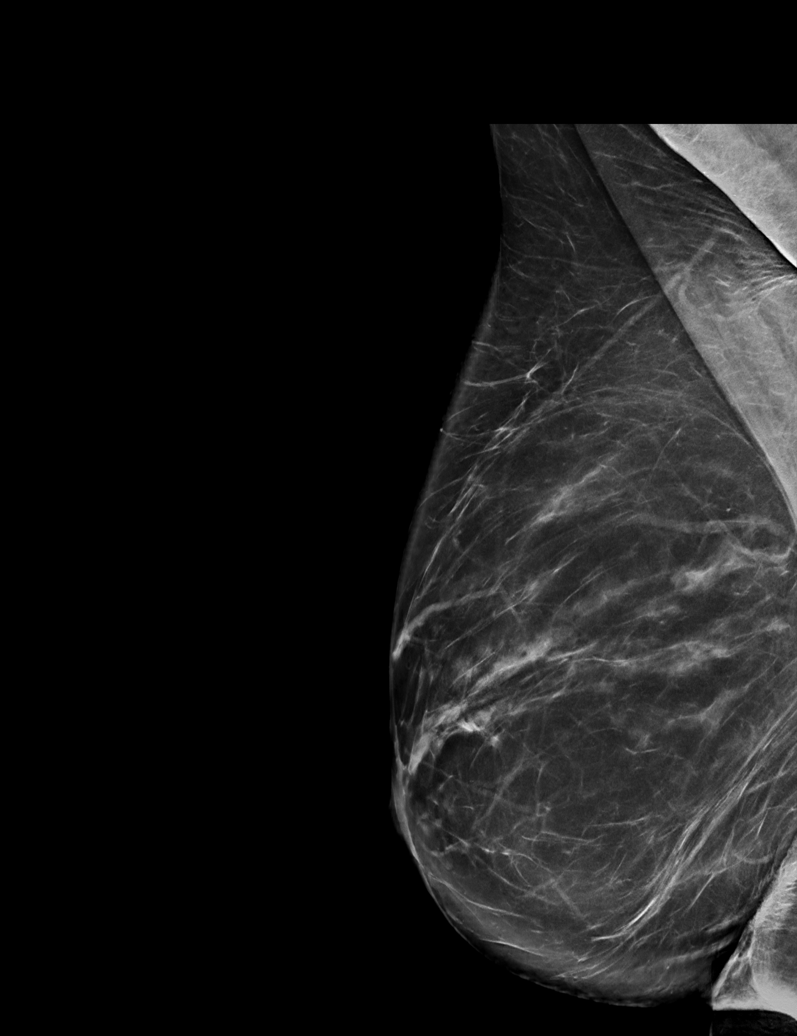

[L MLO synth-2D (2 of 2)]
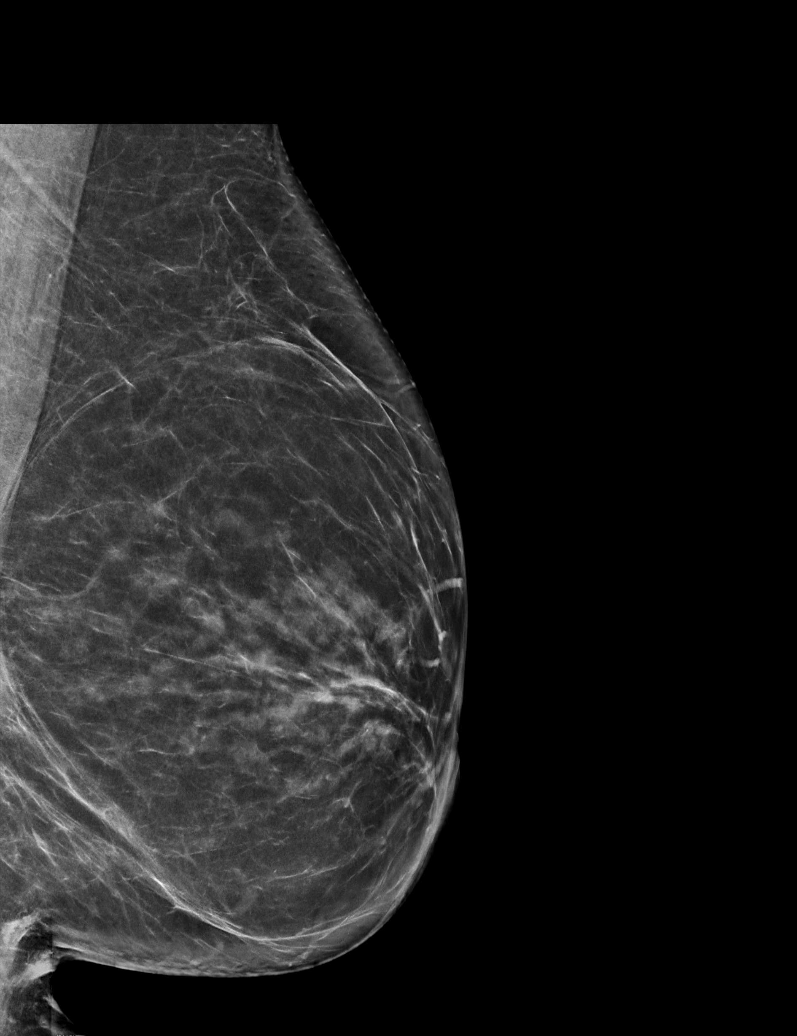

[R MLO synth-2D (2 of 2)]
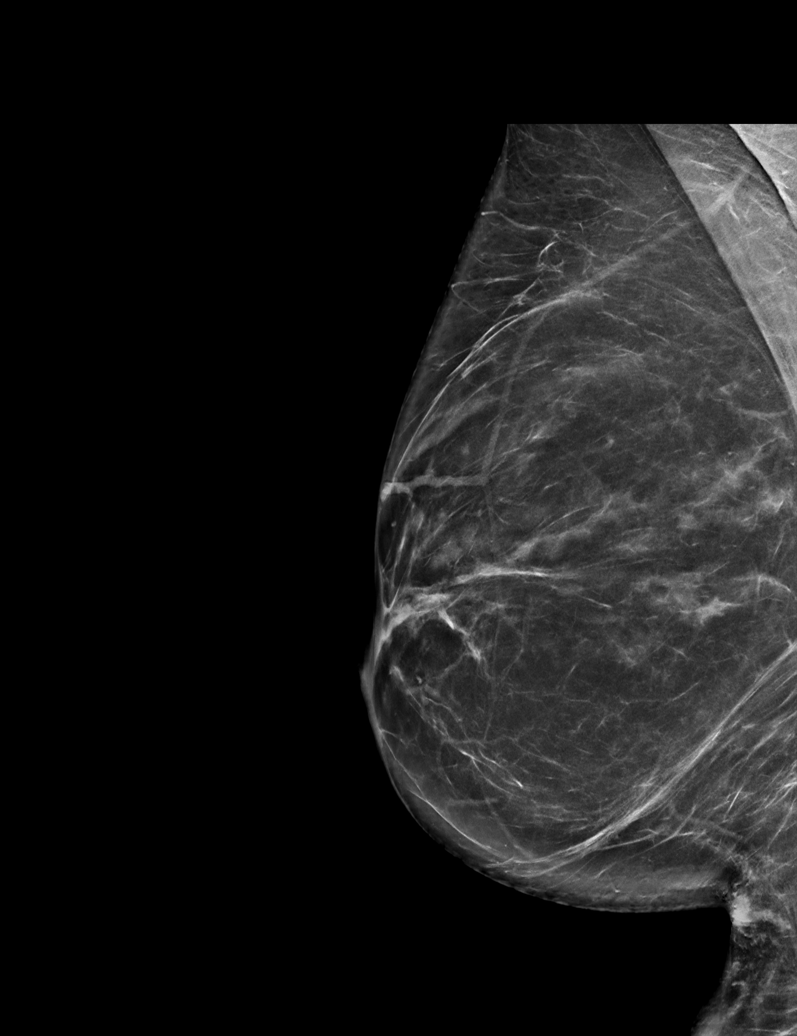

[6 of 36 positions shown; findings below may reference images not displayed]

ACR Breast Density Category b: There are scattered areas of
fibroglandular density.
FINDINGS: There are no findings suspicious for malignancy.
IMPRESSION: No mammographic evidence of malignancy. A result letter of this
screening mammogram will be mailed directly to the patient.

RECOMMENDATION:
Screening mammogram in one year. (Code:51-O-LD2)

BI-RADS CATEGORY  1: Negative.

## 2023-04-22 DIAGNOSIS — K080 Exfoliation of teeth due to systemic causes: Secondary | ICD-10-CM | POA: Diagnosis not present

## 2023-04-27 DIAGNOSIS — E782 Mixed hyperlipidemia: Secondary | ICD-10-CM | POA: Diagnosis not present

## 2023-04-27 DIAGNOSIS — M170 Bilateral primary osteoarthritis of knee: Secondary | ICD-10-CM | POA: Diagnosis not present

## 2023-04-27 DIAGNOSIS — Z6825 Body mass index (BMI) 25.0-25.9, adult: Secondary | ICD-10-CM | POA: Diagnosis not present

## 2023-04-27 DIAGNOSIS — J301 Allergic rhinitis due to pollen: Secondary | ICD-10-CM | POA: Diagnosis not present

## 2023-04-27 DIAGNOSIS — R0683 Snoring: Secondary | ICD-10-CM | POA: Diagnosis not present

## 2023-04-28 DIAGNOSIS — Z23 Encounter for immunization: Secondary | ICD-10-CM | POA: Diagnosis not present

## 2023-05-05 DIAGNOSIS — Z23 Encounter for immunization: Secondary | ICD-10-CM | POA: Diagnosis not present

## 2023-05-18 ENCOUNTER — Other Ambulatory Visit: Payer: Self-pay | Admitting: Internal Medicine

## 2023-05-18 DIAGNOSIS — Z1231 Encounter for screening mammogram for malignant neoplasm of breast: Secondary | ICD-10-CM

## 2023-06-22 ENCOUNTER — Ambulatory Visit
Admission: RE | Admit: 2023-06-22 | Discharge: 2023-06-22 | Disposition: A | Discharge status: Home / Self Care | Payer: Medicare Other | Source: Ambulatory Visit | Attending: Internal Medicine | Admitting: Internal Medicine

## 2023-06-22 DIAGNOSIS — Z1231 Encounter for screening mammogram for malignant neoplasm of breast: Secondary | ICD-10-CM | POA: Diagnosis not present

## 2023-10-07 DIAGNOSIS — E782 Mixed hyperlipidemia: Secondary | ICD-10-CM | POA: Diagnosis not present

## 2023-10-07 DIAGNOSIS — R7303 Prediabetes: Secondary | ICD-10-CM | POA: Diagnosis not present

## 2023-10-12 DIAGNOSIS — J301 Allergic rhinitis due to pollen: Secondary | ICD-10-CM | POA: Diagnosis not present

## 2023-10-12 DIAGNOSIS — L815 Leukoderma, not elsewhere classified: Secondary | ICD-10-CM | POA: Diagnosis not present

## 2023-10-12 DIAGNOSIS — M170 Bilateral primary osteoarthritis of knee: Secondary | ICD-10-CM | POA: Diagnosis not present

## 2023-10-12 DIAGNOSIS — E782 Mixed hyperlipidemia: Secondary | ICD-10-CM | POA: Diagnosis not present

## 2023-10-25 DIAGNOSIS — K080 Exfoliation of teeth due to systemic causes: Secondary | ICD-10-CM | POA: Diagnosis not present

## 2023-11-17 DIAGNOSIS — Z83719 Family history of colon polyps, unspecified: Secondary | ICD-10-CM | POA: Diagnosis not present

## 2023-11-17 DIAGNOSIS — Z1211 Encounter for screening for malignant neoplasm of colon: Secondary | ICD-10-CM | POA: Diagnosis not present

## 2023-11-23 DIAGNOSIS — K080 Exfoliation of teeth due to systemic causes: Secondary | ICD-10-CM | POA: Diagnosis not present

## 2023-12-22 DIAGNOSIS — K080 Exfoliation of teeth due to systemic causes: Secondary | ICD-10-CM | POA: Diagnosis not present

## 2024-02-22 DIAGNOSIS — R310 Gross hematuria: Secondary | ICD-10-CM | POA: Diagnosis not present

## 2024-02-22 DIAGNOSIS — R8290 Unspecified abnormal findings in urine: Secondary | ICD-10-CM | POA: Diagnosis not present

## 2024-02-22 DIAGNOSIS — N200 Calculus of kidney: Secondary | ICD-10-CM | POA: Diagnosis not present

## 2024-02-22 DIAGNOSIS — E782 Mixed hyperlipidemia: Secondary | ICD-10-CM | POA: Diagnosis not present

## 2024-02-22 DIAGNOSIS — N390 Urinary tract infection, site not specified: Secondary | ICD-10-CM | POA: Diagnosis not present

## 2024-02-22 DIAGNOSIS — L815 Leukoderma, not elsewhere classified: Secondary | ICD-10-CM | POA: Diagnosis not present

## 2024-02-23 ENCOUNTER — Ambulatory Visit
Admission: RE | Admit: 2024-02-23 | Discharge: 2024-02-23 | Disposition: A | Discharge status: Home / Self Care | Source: Ambulatory Visit | Attending: Internal Medicine | Admitting: Internal Medicine

## 2024-02-23 ENCOUNTER — Other Ambulatory Visit: Payer: Self-pay | Admitting: Internal Medicine

## 2024-02-23 DIAGNOSIS — R310 Gross hematuria: Secondary | ICD-10-CM | POA: Diagnosis not present

## 2024-02-23 DIAGNOSIS — R10812 Left upper quadrant abdominal tenderness: Secondary | ICD-10-CM

## 2024-02-23 DIAGNOSIS — R1032 Left lower quadrant pain: Secondary | ICD-10-CM | POA: Diagnosis not present

## 2024-03-02 DIAGNOSIS — N390 Urinary tract infection, site not specified: Secondary | ICD-10-CM | POA: Diagnosis not present

## 2024-04-12 DIAGNOSIS — R7303 Prediabetes: Secondary | ICD-10-CM | POA: Diagnosis not present

## 2024-04-12 DIAGNOSIS — E782 Mixed hyperlipidemia: Secondary | ICD-10-CM | POA: Diagnosis not present

## 2024-04-17 ENCOUNTER — Other Ambulatory Visit: Payer: Self-pay | Admitting: Internal Medicine

## 2024-04-17 DIAGNOSIS — Z1231 Encounter for screening mammogram for malignant neoplasm of breast: Secondary | ICD-10-CM

## 2024-04-18 ENCOUNTER — Other Ambulatory Visit: Payer: Self-pay | Admitting: Internal Medicine

## 2024-04-18 DIAGNOSIS — Z0001 Encounter for general adult medical examination with abnormal findings: Secondary | ICD-10-CM | POA: Diagnosis not present

## 2024-04-18 DIAGNOSIS — J302 Other seasonal allergic rhinitis: Secondary | ICD-10-CM | POA: Diagnosis not present

## 2024-04-18 DIAGNOSIS — R1011 Right upper quadrant pain: Secondary | ICD-10-CM

## 2024-04-18 DIAGNOSIS — R1010 Upper abdominal pain, unspecified: Secondary | ICD-10-CM | POA: Diagnosis not present

## 2024-04-18 DIAGNOSIS — R319 Hematuria, unspecified: Secondary | ICD-10-CM

## 2024-04-18 DIAGNOSIS — N3001 Acute cystitis with hematuria: Secondary | ICD-10-CM | POA: Diagnosis not present

## 2024-04-18 DIAGNOSIS — L815 Leukoderma, not elsewhere classified: Secondary | ICD-10-CM | POA: Diagnosis not present

## 2024-04-18 DIAGNOSIS — N200 Calculus of kidney: Secondary | ICD-10-CM | POA: Diagnosis not present

## 2024-04-18 DIAGNOSIS — E782 Mixed hyperlipidemia: Secondary | ICD-10-CM | POA: Diagnosis not present

## 2024-04-21 ENCOUNTER — Ambulatory Visit
Admission: RE | Admit: 2024-04-21 | Discharge: 2024-04-21 | Disposition: A | Source: Ambulatory Visit | Attending: Internal Medicine | Admitting: Internal Medicine

## 2024-04-21 DIAGNOSIS — R1011 Right upper quadrant pain: Secondary | ICD-10-CM

## 2024-04-21 DIAGNOSIS — R319 Hematuria, unspecified: Secondary | ICD-10-CM

## 2024-04-21 DIAGNOSIS — N200 Calculus of kidney: Secondary | ICD-10-CM | POA: Diagnosis not present

## 2024-06-14 DIAGNOSIS — K080 Exfoliation of teeth due to systemic causes: Secondary | ICD-10-CM | POA: Diagnosis not present

## 2024-06-19 DIAGNOSIS — N200 Calculus of kidney: Secondary | ICD-10-CM | POA: Diagnosis not present

## 2024-06-26 ENCOUNTER — Inpatient Hospital Stay: Admission: RE | Admit: 2024-06-26 | Discharge: 2024-06-26 | Attending: Internal Medicine | Admitting: Internal Medicine

## 2024-06-26 DIAGNOSIS — Z1231 Encounter for screening mammogram for malignant neoplasm of breast: Secondary | ICD-10-CM

## 2024-07-10 ENCOUNTER — Other Ambulatory Visit: Payer: Self-pay | Admitting: Urology

## 2024-07-12 ENCOUNTER — Encounter (HOSPITAL_COMMUNITY): Payer: Self-pay | Admitting: Urology

## 2024-07-12 NOTE — Progress Notes (Signed)
 LITHO PREOP PHONE CALL   ALLERGIES REVIEWED: YES  MEDICATION REVIEW DONE: YES MEDICATIONS THAT PT SHOULD HOLD (LIST): NSAIDS hold 48hr  CAN PT WALK UP STAIRS WITHOUT SHORTNESS OF BREATH: YES HOME O2:  CPAP: NO  IF YES, INFORMED PT TO BRING CPAP WITH TUBING AND MASK:YES/NO   INFORMED DRIVER NEEDED FOR PROCEDURE: YES   PT WAS GIVEN BLUE FOLDER AT UROLOGY APPT: YES PT INFORMED TO BRING BLUE FOLDER WITH ALL CONTENTS: YES  REVIEWED ARRIVAL TIME AND LOCATION: YES  OTHER PERTINENT INFORMATION:

## 2024-07-17 ENCOUNTER — Other Ambulatory Visit: Payer: Self-pay

## 2024-07-17 ENCOUNTER — Ambulatory Visit (HOSPITAL_COMMUNITY)

## 2024-07-17 ENCOUNTER — Encounter (HOSPITAL_COMMUNITY): Payer: Self-pay | Admitting: Urology

## 2024-07-17 ENCOUNTER — Encounter (HOSPITAL_COMMUNITY)
Admission: RE | Disposition: A | Discharge status: Home / Self Care | Payer: Self-pay | Source: Home / Self Care | Attending: Urology

## 2024-07-17 ENCOUNTER — Ambulatory Visit (HOSPITAL_COMMUNITY)
Admission: RE | Admit: 2024-07-17 | Discharge: 2024-07-17 | Disposition: A | Discharge status: Home / Self Care | Attending: Urology | Admitting: Urology

## 2024-07-17 DIAGNOSIS — N201 Calculus of ureter: Secondary | ICD-10-CM | POA: Insufficient documentation

## 2024-07-17 DIAGNOSIS — N200 Calculus of kidney: Secondary | ICD-10-CM | POA: Diagnosis present

## 2024-07-17 HISTORY — PX: EXTRACORPOREAL SHOCK WAVE LITHOTRIPSY: SHX1557

## 2024-07-17 SURGERY — LITHOTRIPSY, ESWL
Anesthesia: LOCAL | Laterality: Left

## 2024-07-17 MED ORDER — SODIUM CHLORIDE 0.9 % IV SOLN: INTRAVENOUS | Status: DC

## 2024-07-17 MED ORDER — BISACODYL 5 MG PO TBEC: 5.0000 mg | DELAYED_RELEASE_TABLET | Freq: Every day | ORAL | Status: DC | PRN

## 2024-07-17 MED ORDER — DIPHENHYDRAMINE HCL 25 MG PO CAPS
25.0000 mg | ORAL_CAPSULE | ORAL | Status: AC
  Administered 2024-07-17: 25 mg via ORAL
  Filled 2024-07-17: qty 1

## 2024-07-17 MED ORDER — CIPROFLOXACIN HCL 500 MG PO TABS
500.0000 mg | ORAL_TABLET | ORAL | Status: AC
  Administered 2024-07-17: 500 mg via ORAL
  Filled 2024-07-17: qty 1

## 2024-07-17 MED ORDER — DIAZEPAM 5 MG PO TABS
10.0000 mg | ORAL_TABLET | ORAL | Status: AC
  Administered 2024-07-17: 5 mg via ORAL
  Filled 2024-07-17: qty 2

## 2024-07-17 NOTE — Op Note (Addendum)
 11 mm left proximal ureteral stone   LEFT ESWL - see [facility] full op note  Findings; stone progressed to left proximal ureteral from KUBs in 2025 to today. Stone faded and a fragments broke off but it remained visible. We slowed the rate and had good targeting. She may need a staged procedure if she fails to pass the stone or stone fragments. She tolerated well. Discussed procedure, postop care and f/u with Camellia.

## 2024-07-17 NOTE — Interval H&P Note (Signed)
 History and Physical Interval Note:  07/17/2024 8:43 AM  Laura Evans  has presented today for surgery, with the diagnosis of LEFT KIDNEY STONE.  The various methods of treatment have been discussed with the patient and family. After consideration of risks, benefits and other options for treatment, the patient has consented to  Procedures with comments: LITHOTRIPSY, ESWL (Left) - LEFT EXTRACORPOREAL SHOCKWAVE LITHOTRIPSY as a surgical intervention.  The patient's history has been reviewed, patient examined, no change in status, stable for surgery.  I have reviewed the patient's chart and labs.  Questions were answered to the patient's satisfaction.  She has had some left flank pain. None today. [name] is progressing and now in vicinity of the left proximal ureteral when comparing Aug, Dec and today's KUB. She has no fever, cough, congestion or dysuria.    [name]

## 2024-07-18 ENCOUNTER — Encounter (HOSPITAL_COMMUNITY): Payer: Self-pay | Admitting: Urology

## 2024-08-04 ENCOUNTER — Other Ambulatory Visit: Payer: Self-pay | Admitting: Urology

## 2024-08-04 NOTE — Patient Instructions (Signed)
 SURGICAL WAITING ROOM VISITATION  Patients having surgery or a procedure may have no more than 2 support people in the waiting area - these visitors may rotate.    Children ages 79 and under will not be able to visit patients in [facility] under most circumstances.   Visitors with respiratory illnesses are discouraged from visiting and should remain at home.  If the patient needs to stay at the hospital during part of their recovery, the visitor guidelines for inpatient rooms apply. Pre-op nurse will coordinate an appropriate time for 1 support person to accompany patient in pre-op.  This support person may not rotate.    Please refer to the [facility] website for the visitor guidelines for Inpatients (after your surgery is over and you are in a regular room).       Your procedure is scheduled on: 08/17/24   Report to [facility] Main Entrance    Report to admitting at 6 AM   Call this number if you have problems the morning of surgery [phone]   Do not eat food or drink liquids:After Midnight.but may have sips of water with meds.   Oral Hygiene is also important to reduce your risk of infection.                                    Remember - BRUSH YOUR TEETH THE MORNING OF SURGERY WITH YOUR REGULAR TOOTHPASTE    Stop all vitamins and herbal supplements 7 days before surgery.   Take these medicines the morning of surgery with A SIP OF WATER: none             You may not have any metal on your body including hair pins, jewelry, and body piercing             Do not wear make-up, lotions, powders, perfumes/cologne, or deodorant  Do not wear nail polish including gel and S&S, artificial/acrylic nails, or any other type of covering on natural nails including finger and toenails. If you have artificial nails, gel coating, etc. that needs to be removed by a nail salon please have this removed prior to surgery or surgery may need to be canceled/ delayed if the  surgeon/ anesthesia feels like they are unable to be safely monitored.   Do not shave  48 hours prior to surgery.          Do not bring valuables to the hospital. [facility] IS NOT             RESPONSIBLE   FOR VALUABLES.   Contacts, glasses, dentures or bridgework may not be worn into surgery.    DO NOT BRING YOUR HOME MEDICATIONS TO THE HOSPITAL. PHARMACY WILL DISPENSE MEDICATIONS LISTED ON YOUR MEDICATION LIST TO YOU DURING YOUR ADMISSION IN THE HOSPITAL!    Patients discharged on the day of surgery will not be allowed to drive home.  Someone NEEDS to stay with you for the first 24 hours after anesthesia.   Special Instructions: Bring a copy of your healthcare power of attorney and living will documents the day of surgery if you haven't scanned them before.              Please read over the following fact sheets you were given: IF YOU HAVE QUESTIONS ABOUT YOUR PRE-OP INSTRUCTIONS PLEASE [phone] Laura Evans   If you received a COVID test during your pre-op  visit  it is requested that you wear a mask when out in public, stay away from anyone that may not be feeling well and notify your surgeon if you develop symptoms. If you test positive for Covid or have been in contact with anyone that has tested positive in the last 10 days please notify you surgeon.    [facility] - Preparing for Surgery Before surgery, you can play an important role.  Because skin is not sterile, your skin needs to be as free of germs as possible.  You can reduce the number of germs on your skin by washing with CHG (chlorahexidine gluconate) soap before surgery.  CHG is an antiseptic cleaner which kills germs and bonds with the skin to continue killing germs even after washing. Please DO NOT use if you have an allergy to CHG or antibacterial soaps.  If your skin becomes reddened/irritated stop using the CHG and inform your nurse when you arrive at Short Stay. Do not shave (including legs and underarms) for at  least 48 hours prior to the first CHG shower.  You may shave your face/neck.  Please follow these instructions carefully:  1.  Shower with CHG Soap the night before surgery and the morning of surgery.  2.  If you choose to wash your hair, wash your hair first as usual with your normal  shampoo.  3.  After you shampoo, rinse your hair and body thoroughly to remove the shampoo.                             4.  Use CHG as you would any other liquid soap.  You can apply chg directly to the skin and wash.  Gently with a scrungie or clean washcloth.  5.  Apply the CHG Soap to your body ONLY FROM THE NECK DOWN.   Do   not use on face/ open                           Wound or open sores. Avoid contact with eyes, ears mouth and   genitals (private parts).                       Wash face,  Genitals (private parts) with your normal soap.             6.  Wash thoroughly, paying special attention to the area where your    surgery  will be performed.  7.  Thoroughly rinse your body with warm water from the neck down.  8.  DO NOT shower/wash with your normal soap after using and rinsing off the CHG Soap.                9.  Pat yourself dry with a clean towel.            10.  Wear clean pajamas.            11.  Place clean sheets on your bed the night of your first shower and do not  sleep with pets. Day of Surgery : Do not apply any lotions/deodorants the morning of surgery.  Please wear clean clothes to the hospital/surgery center.  FAILURE TO FOLLOW THESE INSTRUCTIONS MAY RESULT IN THE CANCELLATION OF YOUR SURGERY  PATIENT SIGNATURE_________________________________  NURSE SIGNATURE__________________________________  ________________________________________________________________________

## 2024-08-04 NOTE — Progress Notes (Signed)
 COVID Vaccine received:  []  No []  Yes Date of any COVID positive Test in last 90 days:  PCP - Camellia Hutchinson MD Cardiologist -   Chest x-ray -  EKG -   Stress Test -  ECHO -  Cardiac Cath -   Bowel Prep - []  No  []   Yes ______  Pacemaker / ICD device []  No []  Yes   Spinal Cord Stimulator:[]  No []  Yes       History of Sleep Apnea? []  No []  Yes   CPAP used?- []  No []  Yes    Does the patient monitor blood sugar?          []  No []  Yes  []  N/A  Patient has: []  NO Hx DM   []  Pre-DM                 []  DM1  []   DM2 Does patient have a [employer] or Dexacom? []  No []  Yes   Fasting Blood Sugar Ranges-  Checks Blood Sugar _____ times a day  GLP1 agonist / usual dose -  GLP1 instructions:  SGLT-2 inhibitors / usual dose -  SGLT-2 instructions:   Blood Thinner / Instructions: Aspirin Instructions:  Comments:   Activity level: Patient is able / unable to climb a flight of stairs without difficulty; []  No CP  []  No SOB, but would have ___   Patient can / can not perform ADLs without assistance.   Anesthesia review:   Patient denies shortness of breath, fever, cough and chest pain at PAT appointment.  Patient verbalized understanding and agreement to the Pre-Surgical Instructions that were given to them at this PAT appointment. Patient was also educated of the need to review these PAT instructions again prior to his/her surgery.I reviewed the appropriate phone numbers to call if they have any and questions or concerns.

## 2024-08-07 ENCOUNTER — Other Ambulatory Visit: Payer: Self-pay | Admitting: Urology

## 2024-08-14 ENCOUNTER — Encounter (HOSPITAL_COMMUNITY): Admission: RE | Admit: 2024-08-14 | Source: Ambulatory Visit

## 2024-08-14 DIAGNOSIS — Z01818 Encounter for other preprocedural examination: Secondary | ICD-10-CM

## 2024-08-17 ENCOUNTER — Ambulatory Visit (HOSPITAL_COMMUNITY): Admission: RE | Admit: 2024-08-17 | Admitting: Urology

## 2024-08-17 ENCOUNTER — Encounter: Admission: RE | Payer: Self-pay

## 2024-08-17 DIAGNOSIS — Z01818 Encounter for other preprocedural examination: Secondary | ICD-10-CM
